# Patient Record
Sex: Female | Born: 1965 | Race: White | Hispanic: No | Marital: Married | Smoking: Current every day smoker
Health system: Southern US, Community
[De-identification: ages and names within clinical notes are randomized; demographics above are authoritative.]

## PROBLEM LIST (undated history)

## (undated) DIAGNOSIS — E079 Disorder of thyroid, unspecified: Secondary | ICD-10-CM

## (undated) DIAGNOSIS — F32A Depression, unspecified: Secondary | ICD-10-CM

## (undated) DIAGNOSIS — E119 Type 2 diabetes mellitus without complications: Secondary | ICD-10-CM

## (undated) DIAGNOSIS — B1910 Unspecified viral hepatitis B without hepatic coma: Secondary | ICD-10-CM

## (undated) DIAGNOSIS — M797 Fibromyalgia: Secondary | ICD-10-CM

## (undated) DIAGNOSIS — I10 Essential (primary) hypertension: Secondary | ICD-10-CM

## (undated) DIAGNOSIS — F419 Anxiety disorder, unspecified: Secondary | ICD-10-CM

## (undated) DIAGNOSIS — F329 Major depressive disorder, single episode, unspecified: Secondary | ICD-10-CM

## (undated) DIAGNOSIS — E7800 Pure hypercholesterolemia, unspecified: Secondary | ICD-10-CM

## (undated) HISTORY — PX: ANKLE SURGERY: SHX546

## (undated) HISTORY — DX: Essential (primary) hypertension: I10

## (undated) HISTORY — DX: Fibromyalgia: M79.7

## (undated) HISTORY — DX: Major depressive disorder, single episode, unspecified: F32.9

## (undated) HISTORY — DX: Unspecified viral hepatitis B without hepatic coma: B19.10

## (undated) HISTORY — PX: ABDOMINAL HYSTERECTOMY: SHX81

## (undated) HISTORY — DX: Pure hypercholesterolemia, unspecified: E78.00

## (undated) HISTORY — PX: CARPAL TUNNEL RELEASE: SHX101

## (undated) HISTORY — DX: Disorder of thyroid, unspecified: E07.9

## (undated) HISTORY — DX: Depression, unspecified: F32.A

## (undated) HISTORY — DX: Type 2 diabetes mellitus without complications: E11.9

## (undated) HISTORY — DX: Anxiety disorder, unspecified: F41.9

---

## 2002-04-04 ENCOUNTER — Encounter: Admission: RE | Admit: 2002-04-04 | Discharge: 2002-04-04 | Payer: Self-pay | Admitting: Gastroenterology

## 2002-04-04 ENCOUNTER — Encounter: Payer: Self-pay | Admitting: Gastroenterology

## 2002-04-17 ENCOUNTER — Ambulatory Visit (HOSPITAL_COMMUNITY): Admission: RE | Admit: 2002-04-17 | Discharge: 2002-04-17 | Payer: Self-pay | Admitting: Gastroenterology

## 2007-11-19 ENCOUNTER — Ambulatory Visit (HOSPITAL_COMMUNITY): Admission: RE | Admit: 2007-11-19 | Discharge: 2007-11-19 | Payer: Self-pay | Admitting: Neurosurgery

## 2007-12-10 ENCOUNTER — Ambulatory Visit (HOSPITAL_COMMUNITY): Admission: RE | Admit: 2007-12-10 | Discharge: 2007-12-10 | Payer: Self-pay | Admitting: Neurosurgery

## 2009-11-27 IMAGING — CT CT CERVICAL SPINE W/ CM
4 of 7 series · 13 of 33 positions shown, 15 images · non-contrast
Comparison: None.
COMPARISON: None.

CLINICAL DATA: Chronic neck pain
TECHNIQUE: Multidetector CT imaging of the cervical spine was
performed following myelography.  Multiplanar CT image
reconstructions were also generated.

[Series 4: 2mm axial soft tissue · axial · 0.23mm/px · z∈[-203,-151]mm · 2 of 79 slices shown (1 of 2)]
[im 27/79  soft-tissue]
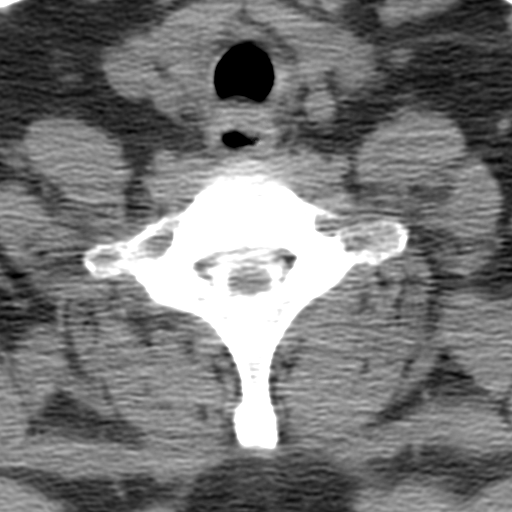
[im 53/79  soft-tissue]
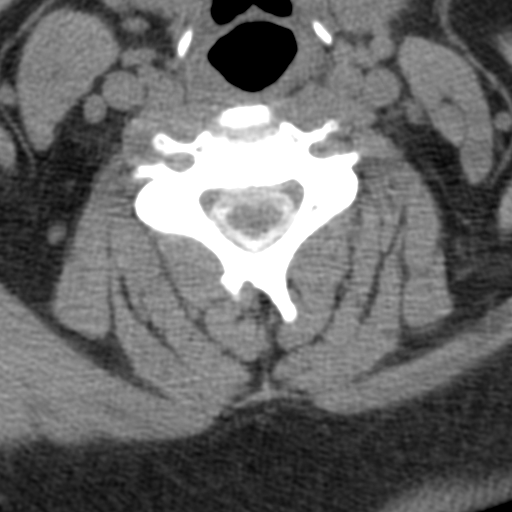

[Series 8: 2mm axial soft tissue · axial · 0.23mm/px · z∈[-207,-111]mm · 3 of 98 slices shown, 4 images (2 of 2)]
[im 25/98  soft-tissue]
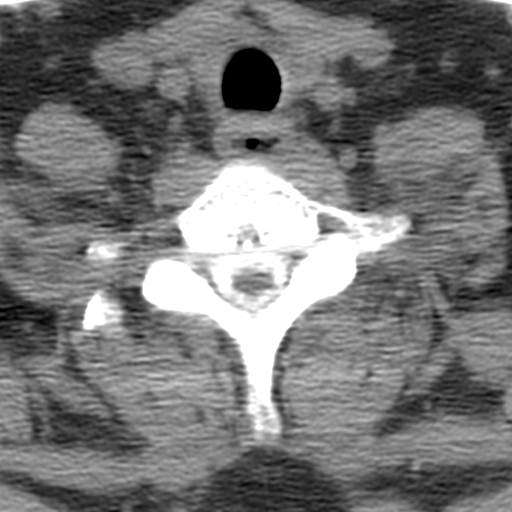
[im 25/98  bone]
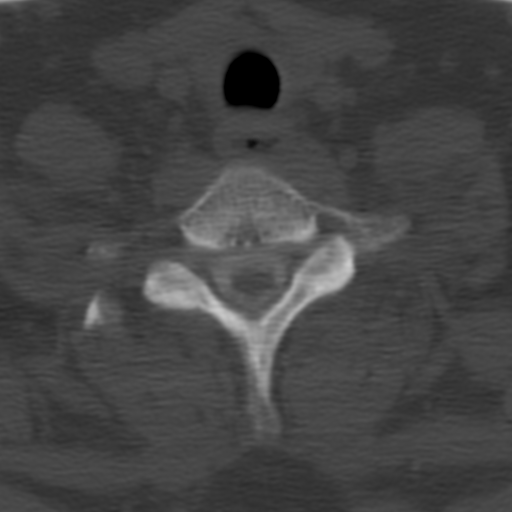
[im 49/98  bone]
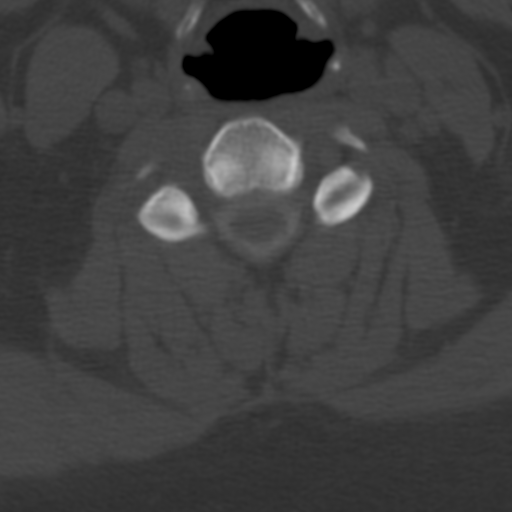
[im 73/98  bone]
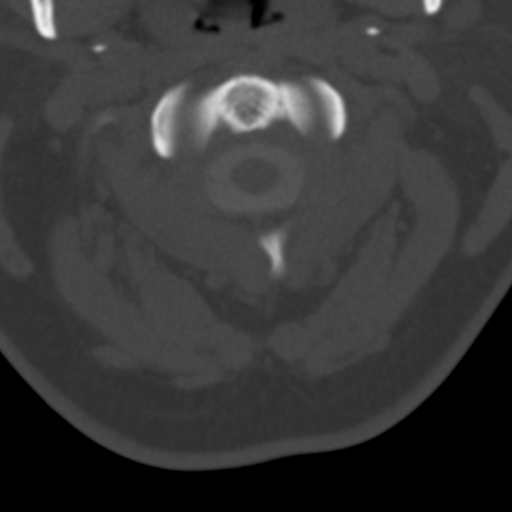

[Series 602: coronal detail · coronal · 0.38mm/px · 3 of 53 slices shown]
[im 11/53  bone]
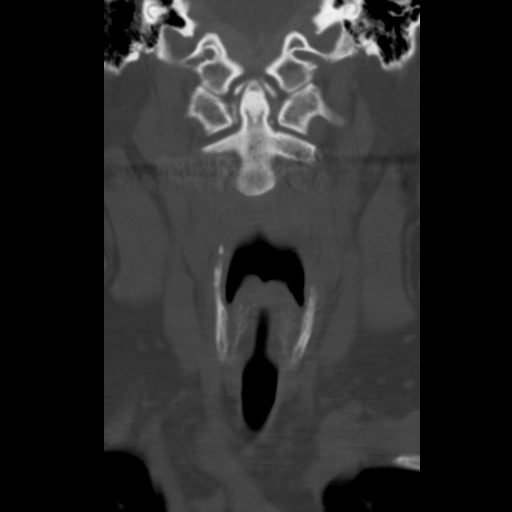
[im 21/53  bone]
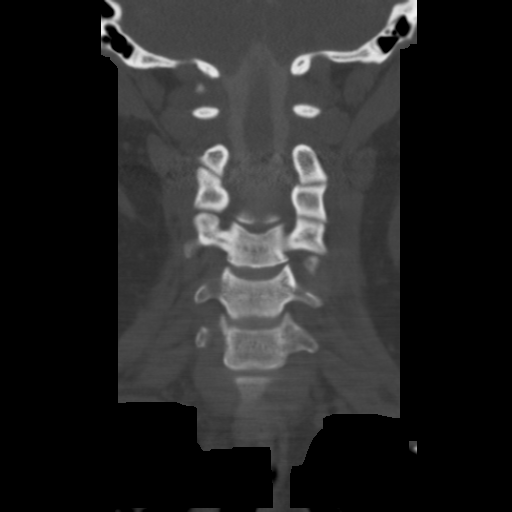
[im 32/53  bone]
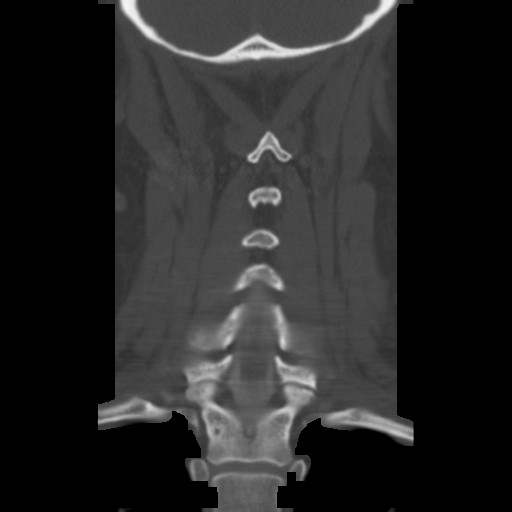

[Series 603: sagittal detail c-spine · sagittal · 0.38mm/px · 5 of 56 slices shown, 6 images]
[im 19/56  bone]
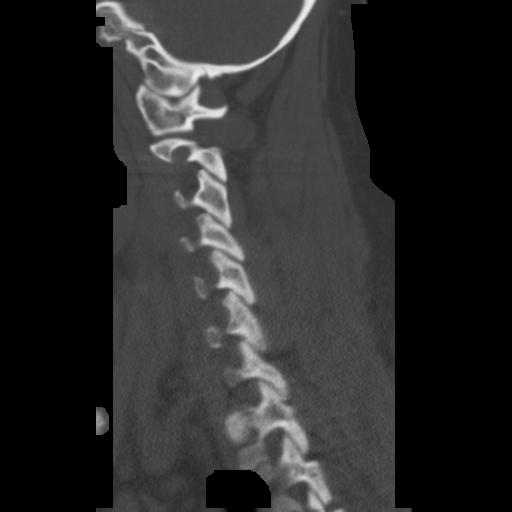
[im 23/56  bone]
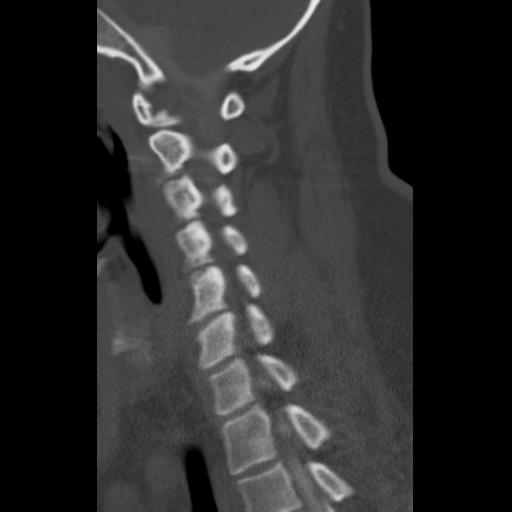
[im 28/56  soft-tissue]
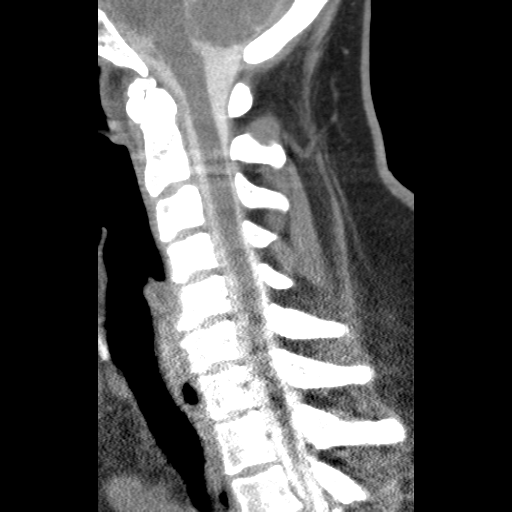
[im 28/56  bone]
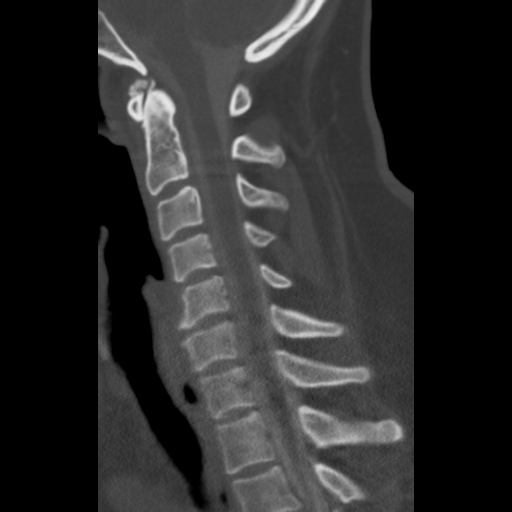
[im 33/56  bone]
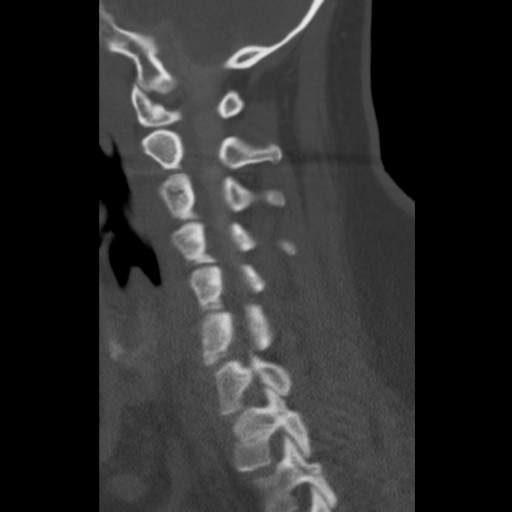
[im 37/56  bone]
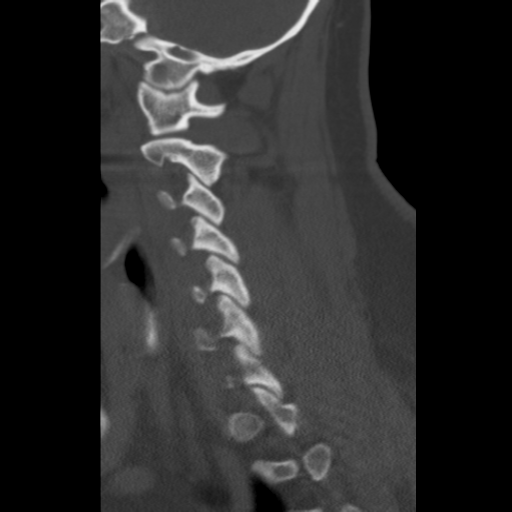

[13 of 33 positions shown; findings below may reference images not displayed]

MYELOGRAM CERVICAL

 Lumbar puncture was performed by Dr. Siroit, [facility] spine and
brain, at the L5-S1 level using a 22 gauge needle with return of
clear CSF.  10 cc of Omnipaque 300 was injected into the
subarachnoid space.  With the neck extended, the contrast was
allowed to flow into the cervical spine and  and myelographic
images were obtained.
FINDINGS: There is no significant nerve root cut off or spinal
stenosis.  Alignment is anatomic.  There appears to be mild annular
bulging at  C5-6.

Fluoroscopy Time: 1.19 minutes, lowest pulsed technique
IMPRESSION: As above

CT MYELOGRAPHY CERVICAL SPINE
FINDINGS: Anatomic alignment is present.  Craniocervical junction
unremarkable. Minimal degenerative change C1-C2 articulation.

C2-3: Normal.

C3-4: Normal.

C4-5: Normal.

C5-6: Mild bulge.  No stenosis or disc protrusion.

C6-7: Normal.

C7-T1: Normal.

No neck masses.  Airway widely patent.  Lung apices clear.  No
vascular calcification.
IMPRESSION: Mild annular bulging at C5-6 without compressive lesion or spinal
stenosis.

## 2010-09-25 ENCOUNTER — Encounter: Payer: Self-pay | Admitting: Neurosurgery

## 2011-01-20 NOTE — Op Note (Signed)
   NAME:  Catherine Ewing, Catherine Ewing                          ACCOUNT NO.:  [id number]   MEDICAL RECORD NO.:  [id number]                   PATIENT TYPE:  AMB   LOCATION:  ENDO                                 FACILITY:  [facility]   PHYSICIAN:  James L. Malon Kindle., M.D.          DATE OF BIRTH:  Mar 27, 1966   DATE OF PROCEDURE:  04/17/2002  DATE OF DISCHARGE:  04/17/2002                                 OPERATIVE REPORT   PROCEDURE:  Colonoscopy.   MEDICATIONS:  1. Cipro 400 mg  IV, due to history of heart murmur and the fact that she     was told that she needed medications for her murmur.  2. Fentanyl 50 mcg IV.  3. Versed 6 mg IV.   ENDOSCOPE:  Olympus adult video colonoscope.   INDICATIONS:  Rectal bleeding and a very strong family history of colon  cancer in grandmother.   DESCRIPTION OF PROCEDURE:  The patient procedure had been explained to the  patient and consent obtained.  The patient went to the operating room, and  was placed in the left lateral decubitus position.  The Olympus adult video  colonoscope inserted and advanced under direct visualization.  The scope was  passed to the cecum and the ileocecal valve and appendiceal orifice were  seen.  The scope was withdrawn, taking pictures in the colon and hepatic  flexure; transverse colon, splenic flexure, descending and sigmoid colon  seen well upon removal.  A large internal hemorrhoid seen in the rectum upon  retroflex view.  The scope was withdrawn and the patient tolerated the  procedure well.  Maintained on low-flow oxygen with pulse oximetry.   ASSESSMENT:  1. Rectal bleeding, probably due to internal hemorrhoids.  2. Family history in grandparent of colon cancer.   PLAN:  Give fiber, hemorrhoid sheet.  Recommend another colonoscopy at age  45 and see back in the office in two to three months.                                                James L. Malon Kindle., M.D.    Waldron Session  D:  04/17/2002  T:  04/19/2002  Job:   [id number]   cc:   Gaynelle Cage

## 2013-06-05 ENCOUNTER — Telehealth (HOSPITAL_COMMUNITY): Payer: Self-pay | Admitting: Dietician

## 2013-06-05 NOTE — Telephone Encounter (Signed)
Received referral via fax from Dr. Nyland for dx: diabetes.  

## 2013-06-05 NOTE — Telephone Encounter (Signed)
Called and left message on voicemail at [phone].

## 2013-06-12 NOTE — Telephone Encounter (Signed)
No response to previous contact attempt. Sent letter to pt home via US Mail in attempt to contact pt to schedule appointment.  

## 2013-06-18 NOTE — Telephone Encounter (Signed)
No response to previous contact attempts. Referral filed.  

## 2013-10-01 ENCOUNTER — Encounter: Payer: Self-pay | Admitting: Physical Medicine & Rehabilitation

## 2013-11-11 ENCOUNTER — Ambulatory Visit (HOSPITAL_COMMUNITY)
Admission: RE | Admit: 2013-11-11 | Discharge: 2013-11-11 | Disposition: A | Discharge status: Home / Self Care | Payer: Medicaid Other | Source: Ambulatory Visit | Attending: Physical Medicine & Rehabilitation | Admitting: Physical Medicine & Rehabilitation

## 2013-11-11 ENCOUNTER — Encounter: Payer: Self-pay | Admitting: Physical Medicine & Rehabilitation

## 2013-11-11 ENCOUNTER — Encounter: Payer: Medicaid Other | Attending: Physical Medicine & Rehabilitation | Admitting: Physical Medicine & Rehabilitation

## 2013-11-11 VITALS — BP 137/73 | HR 96 | Resp 16 | Ht 69.0 in | Wt 223.0 lb

## 2013-11-11 DIAGNOSIS — IMO0001 Reserved for inherently not codable concepts without codable children: Secondary | ICD-10-CM | POA: Insufficient documentation

## 2013-11-11 DIAGNOSIS — R141 Gas pain: Secondary | ICD-10-CM | POA: Insufficient documentation

## 2013-11-11 DIAGNOSIS — Z79899 Other long term (current) drug therapy: Secondary | ICD-10-CM

## 2013-11-11 DIAGNOSIS — Z5181 Encounter for therapeutic drug level monitoring: Secondary | ICD-10-CM

## 2013-11-11 DIAGNOSIS — G894 Chronic pain syndrome: Secondary | ICD-10-CM | POA: Insufficient documentation

## 2013-11-11 DIAGNOSIS — K5900 Constipation, unspecified: Secondary | ICD-10-CM | POA: Insufficient documentation

## 2013-11-11 DIAGNOSIS — G4700 Insomnia, unspecified: Secondary | ICD-10-CM

## 2013-11-11 DIAGNOSIS — E1149 Type 2 diabetes mellitus with other diabetic neurological complication: Secondary | ICD-10-CM | POA: Insufficient documentation

## 2013-11-11 DIAGNOSIS — F32A Depression, unspecified: Secondary | ICD-10-CM

## 2013-11-11 DIAGNOSIS — F329 Major depressive disorder, single episode, unspecified: Secondary | ICD-10-CM

## 2013-11-11 DIAGNOSIS — E1142 Type 2 diabetes mellitus with diabetic polyneuropathy: Secondary | ICD-10-CM

## 2013-11-11 DIAGNOSIS — F3289 Other specified depressive episodes: Secondary | ICD-10-CM | POA: Insufficient documentation

## 2013-11-11 DIAGNOSIS — F17200 Nicotine dependence, unspecified, uncomplicated: Secondary | ICD-10-CM | POA: Insufficient documentation

## 2013-11-11 DIAGNOSIS — R143 Flatulence: Secondary | ICD-10-CM

## 2013-11-11 DIAGNOSIS — R142 Eructation: Secondary | ICD-10-CM | POA: Insufficient documentation

## 2013-11-11 DIAGNOSIS — I10 Essential (primary) hypertension: Secondary | ICD-10-CM | POA: Insufficient documentation

## 2013-11-11 MED ORDER — CYCLOBENZAPRINE HCL 10 MG PO TABS: 10.0000 mg | ORAL_TABLET | Freq: Every day | ORAL | Status: DC

## 2013-11-11 MED ORDER — GABAPENTIN 300 MG PO CAPS: 300.0000 mg | ORAL_CAPSULE | Freq: Three times a day (TID) | ORAL | Status: AC

## 2013-11-11 NOTE — Progress Notes (Signed)
Subjective:    Patient ID: Catherine Ewing, female    DOB: Jan 24, 1966, 48 y.o.   MRN: [id number]  HPI  This is an initial visit for Catherine Ewing. She is having diffuse body pain. She feels miserable today, and has had increased pain since GB surgery a month. She has gained "25lbs" since surgery and feels increasingly bloated.   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 At that time she fell at work and "messed up" her right knee and ankle as well as her low back. She had several ESI's and management per Dr. Noel Gerold her in [place] and nothing really seemed to work. A year ago, she continued to have numbness and pain and she fell and broke her "shin" bones.   She was placed on lyrica for about a year and a half and eventually was switched to gabapentin because of cost. She has ibuprofen which she rarely uses.   Her history is also significant for DM and hypothyroidism as well as vit D deficiency   Her depression has grown worse because of all the problems. She has been on zoloft for about 15 years after her partial hysterectomy. She has been on 100mg  essentially the whole time.   She was placed on hydrocodone for a period of time which helped slightly.   At this point, she is having defuse body pain.    Pain Inventory Average Pain 9 Pain Right Now 7 My pain is constant, sharp, burning, tingling and aching  In the last 24 hours, has pain interfered with the following? General activity 9 Relation with others 8 Enjoyment of life 10 What TIME of day is your pain at its worst? daytime Sleep (in general) Fair  Pain is worse with: bending, sitting and standing Pain improves with: rest, medication and TENS Relief from Meds: 7  Mobility walk without assistance walk with assistance use a cane how many minutes can you walk? 5-10 ability to climb steps?  yes do you drive?  yes Do you have any goals in this area?  yes  Function not employed: date last employed June 2013 I need assistance with  the following:  household duties and shopping Do you have any goals in this area?  yes  Neuro/Psych weakness numbness tingling depression anxiety  Prior Studies x-rays CT/MRI  Physicians involved in your care [facility]   Family History  Problem Relation Age of Onset  . Cancer Mother   . Heart disease Mother   . Hypertension Mother   . Stroke Father    History   Social History  . Marital Status: Married    Spouse Name: N/A    Number of Children: N/A  . Years of Education: N/A   Social History Main Topics  . Smoking status: Current Every Day Smoker  . Smokeless tobacco: Never Used  . Alcohol Use: Yes  . Drug Use: No  . Sexual Activity: None   Other Topics Concern  . None   Social History Narrative  . None   Past Surgical History  Procedure Laterality Date  . Cesarean section    . Abdominal hysterectomy    . Ankle surgery    . Carpal tunnel release     Past Medical History  Diagnosis Date  . Depression   . Diabetes mellitus without complication   . Thyroid disease   . Hypertension    BP 137/73  Pulse 96  Resp 16  Ht 5\' 9"  (1.753 m)  Wt 223 lb (101.152 kg)  BMI 32.92 kg/m2  SpO2 99%  Opioid Risk Score: 1 Fall Risk Score: Moderate Fall Risk (6-13 points) (patient educated handout given)   Review of Systems  Constitutional: Positive for diaphoresis, appetite change and unexpected weight change.  Respiratory: Positive for shortness of breath.   Musculoskeletal: Positive for arthralgias, back pain and myalgias.  Neurological: Positive for weakness and numbness.  Psychiatric/Behavioral: Positive for dysphoric mood. The patient is nervous/anxious.   All other systems reviewed and are negative.       Objective:   Physical Exam   General: sitting on exam table. Hair highlighted. Smells of cigarettes HEENT: Head is normocephalic, atraumatic, PERRLA, EOMI, sclera anicteric, oral mucosa pink and moist, dentition  intact, ext ear canals clear,  Neck: Supple without JVD or lymphadenopathy Heart: Reg rate and rhythm. No murmurs rubs or gallops Chest: CTA bilaterally without wheezes, rales, or rhonchi; no distress Abdomen: Soft, non-tender, non-distended, bowel sounds positive. Extremities: No clubbing, cyanosis, or edema. Pulses are 2+ Skin: Clean and intact without signs of breakdown Neuro: Pt is cognitively appropriate with normal insight, memory, and awareness. Cranial nerves 2-12 are intact. Sensory exam is decreased to PP and LT below both knees and to the wrists bilaterally. DTR's are tr to 1+.. b. Fine motor coordination is fair. No tremors. Motor function is grossly 4 to 5/5 except where there is pain inhibition.  Musculoskeletal: pelvis aligned. Mild dextroscoliosis and significant spasm in right lumbar paraspinals. May be mild clockwise rotation. Antalgic with gait on the left. Pain is in left hip when she walks. SLR equivocal. SST equivocal. Low back at beltline tender to palpation. Limited in all planes of movement.  Psych: Pt's affect is depressed, anxious. She appears fatigued.         Assessment & Plan:  1. Chronic Pain syndrome: ?Fibromyalgia 2. Diabetes with peripheral neuropathy 3. Depression 4. Insomnia 5. Abdominal pain :  Slight distention. 25 lb weight gain over the last month after GB resection. Minimal bowel sounds on exam   Plan: 1. Need lumbar MRI to help define treatment plan 2. Increase gabapentin to 300mg  TID 3. Add flexeril 10-20mg  qhs. Consider TCA at night. 4. Will likely need to change antidepressant 5. Would like to pursue therapy pending imaging results.  6. Ordered KUB to see if there is an ileus/obstipation. Needs to see pcp and potentially surgeon pending results.  7. Follow up in a month. Spent 45 minutes with this patient today.  8. Needs follow up of thyroid and vit d levels.

## 2013-11-11 NOTE — Patient Instructions (Signed)
I NEED A COPY OR AT LEAST A REPORT OF YOUR LOW BACK MRI   PLEASE CALL ME WITH ANY PROBLEMS OR QUESTIONS [phone]).

## 2013-11-11 NOTE — Addendum Note (Signed)
Addended by: Judd GaudierLEVENS, SHANNON M on: 11/11/2013 10:56 AM   Modules accepted: Orders

## 2013-11-12 ENCOUNTER — Telehealth: Payer: Self-pay | Admitting: Physical Medicine & Rehabilitation

## 2013-11-12 NOTE — Telephone Encounter (Signed)
KUB showed no gas or bowel obstruction. Essentially was normal

## 2013-11-13 NOTE — Telephone Encounter (Signed)
Returned call, Left message for patient to call office regarding KUB results.

## 2013-12-12 ENCOUNTER — Telehealth: Payer: Self-pay

## 2013-12-12 MED ORDER — BACLOFEN 10 MG PO TABS: 10.0000 mg | ORAL_TABLET | Freq: Three times a day (TID) | ORAL | Status: AC | PRN

## 2013-12-12 NOTE — Telephone Encounter (Signed)
Patient is requesting a different medication for spasms. She said the Flexeril is not working. Please advise.

## 2013-12-12 NOTE — Telephone Encounter (Signed)
Patient informed of medication changed.  She will stop flexeril and start baclofen.

## 2013-12-12 NOTE — Telephone Encounter (Signed)
Wrote rx for baclofen and sent to pharmacy. Stop flexeril

## 2013-12-30 ENCOUNTER — Encounter: Payer: Medicaid Other | Attending: Physical Medicine & Rehabilitation | Admitting: Physical Medicine & Rehabilitation

## 2013-12-30 ENCOUNTER — Encounter: Payer: Self-pay | Admitting: Physical Medicine & Rehabilitation

## 2013-12-30 ENCOUNTER — Encounter: Payer: Medicaid Other | Admitting: Physical Medicine & Rehabilitation

## 2013-12-30 VITALS — BP 150/80 | HR 99 | Resp 14 | Ht 69.0 in | Wt 218.0 lb

## 2013-12-30 DIAGNOSIS — G4700 Insomnia, unspecified: Secondary | ICD-10-CM

## 2013-12-30 DIAGNOSIS — Z5181 Encounter for therapeutic drug level monitoring: Secondary | ICD-10-CM

## 2013-12-30 DIAGNOSIS — M47817 Spondylosis without myelopathy or radiculopathy, lumbosacral region: Secondary | ICD-10-CM

## 2013-12-30 DIAGNOSIS — E1142 Type 2 diabetes mellitus with diabetic polyneuropathy: Secondary | ICD-10-CM

## 2013-12-30 DIAGNOSIS — E1149 Type 2 diabetes mellitus with other diabetic neurological complication: Secondary | ICD-10-CM

## 2013-12-30 DIAGNOSIS — F32A Depression, unspecified: Secondary | ICD-10-CM

## 2013-12-30 DIAGNOSIS — K7460 Unspecified cirrhosis of liver: Secondary | ICD-10-CM

## 2013-12-30 DIAGNOSIS — IMO0001 Reserved for inherently not codable concepts without codable children: Secondary | ICD-10-CM

## 2013-12-30 DIAGNOSIS — Z79899 Other long term (current) drug therapy: Secondary | ICD-10-CM

## 2013-12-30 DIAGNOSIS — F3289 Other specified depressive episodes: Secondary | ICD-10-CM

## 2013-12-30 DIAGNOSIS — F329 Major depressive disorder, single episode, unspecified: Secondary | ICD-10-CM | POA: Insufficient documentation

## 2013-12-30 DIAGNOSIS — K5900 Constipation, unspecified: Secondary | ICD-10-CM

## 2013-12-30 MED ORDER — DULOXETINE HCL 30 MG PO CPEP: 30.0000 mg | ORAL_CAPSULE | Freq: Every day | ORAL | Status: DC

## 2013-12-30 MED ORDER — OXYCODONE HCL 5 MG PO TABS: 5.0000 mg | ORAL_TABLET | Freq: Four times a day (QID) | ORAL | Status: DC | PRN

## 2013-12-30 NOTE — Progress Notes (Signed)
Subjective:    Patient ID: Catherine KayMelissa S Ewing, female    DOB: 1965-11-07, 48 y.o.   MRN: [id number]  HPI  Catherine Ewing is back regarding her chronic pain. She had an MRI done at [facility] in March which shows mutlilevel facet disease and small DDD and HNP's. Also of note is considerable ascites and cirrhosis of the liver.  Her mood has continued to plummet. She has been on zoloft for 15 years and it doesn't seem to be working any more.   Her pain is mostly centered in her low back and pelvic areas. It is painful to lie flat, bend, or walk. It tends to bother her more on the left than the right. She uses a cane for balance. Her left leg still feels numb.   We increased her gabapentin at last visit without dramatic effect. It tends to "hype" her up more than it causes any sedation.   She was placed on invokana which is causing her to urinate quite a bit---this keeps her up at night.   She has an appt to see GI this afternoon to discuss the treatment plan for her ascites.   Pain Inventory Average Pain 7 Pain Right Now 6 My pain is constant, sharp, burning, tingling and aching  In the last 24 hours, has pain interfered with the following? General activity 3 Relation with others 4 Enjoyment of life 3 What TIME of day is your pain at its worst? evening, night Sleep (in general) Poor  Pain is worse with: sitting, standing and some activites Pain improves with: pacing activities, medication and TENS Relief from Meds: 6  Mobility walk with assistance use a cane how many minutes can you walk? 5-10 ability to climb steps?  yes do you drive?  yes Do you have any goals in this area?  yes  Function not employed: date last employed 03/03/11 I need assistance with the following:  household duties and shopping Do you have any goals in this area?  yes  Neuro/Psych weakness numbness tingling spasms depression  Prior Studies Any changes since last visit?  yes CT/MRI  Physicians involved  in your care Any changes since last visit?  no   Family History  Problem Relation Age of Onset  . Cancer Mother   . Heart disease Mother   . Hypertension Mother   . Stroke Father    History   Social History  . Marital Status: Married    Spouse Name: N/A    Number of Children: N/A  . Years of Education: N/A   Social History Main Topics  . Smoking status: Current Every Day Smoker  . Smokeless tobacco: Never Used  . Alcohol Use: Yes  . Drug Use: No  . Sexual Activity: None   Other Topics Concern  . None   Social History Narrative  . None   Past Surgical History  Procedure Laterality Date  . Cesarean section    . Abdominal hysterectomy    . Ankle surgery    . Carpal tunnel release     Past Medical History  Diagnosis Date  . Depression   . Diabetes mellitus without complication   . Thyroid disease   . Hypertension    BP 150/80  Pulse 99  Resp 14  Ht 5\' 9"  (1.753 m)  Wt 218 lb (98.884 kg)  BMI 32.18 kg/m2  SpO2 99%  Opioid Risk Score:   Fall Risk Score: Moderate Fall Risk (6-13 points) (pt educated and given a brochure on  fall risk previously)   Review of Systems  Constitutional: Positive for diaphoresis, appetite change and unexpected weight change.  Gastrointestinal: Positive for nausea and diarrhea.  Endocrine:       High blood sugar  Musculoskeletal: Positive for back pain.  Neurological: Positive for weakness and numbness.       Tingling, spasms  Psychiatric/Behavioral:       Depression  All other systems reviewed and are negative.      Objective:   Physical Exam  General: sitting on exam table. Hair highlighted. Smells of cigarettes  HEENT: Head is normocephalic, atraumatic, PERRLA, EOMI, sclera anicteric, oral mucosa pink and moist, dentition intact, ext ear canals clear,  Neck: Supple without JVD or lymphadenopathy  Heart: Reg rate and rhythm. No murmurs rubs or gallops  Chest: CTA bilaterally without wheezes, rales, or rhonchi; no  distress  Abdomen: Soft,  distended, fluid noted Extremities: No clubbing, cyanosis, or trace LLE edema. Pulses are 2+  Skin: Clean and intact without signs of breakdown  Neuro: Pt is cognitively appropriate with normal insight, memory, and awareness. Cranial nerves 2-12 are intact. Sensory exam is decreased to PP and LT below both knees and to the wrists bilaterally. DTR's are tr to 1+.. b. Fine motor coordination is fair. No tremors. Motor function is grossly 4 to 5/5 except where there is pain inhibition.  Musculoskeletal: pelvis aligned. Mild dextroscoliosis and significant spasm in right lumbar paraspinals.   mild clockwise rotation. Antalgic with gait on the left. Pain is in left hip and low back with weight bearing   SLR equivocal. SST equivocal. Low back at beltline tender to palpation. Limited in all planes of movement.  Psych: Pt's affect is depressed, tearful and anxious. She appears fatigued.   Assessment & Plan:   1. Chronic Pain syndrome: ?Fibromyalgia  2. Diabetes with peripheral neuropathy  3. Depression  4. Insomnia  5. Abdominal pain with ascites and cirrhosis, Hep B +? 6. 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 Will try low dose oxycodone for pain 5mg  q12 prn #60.   2. Maintain  gabapentin at 300mg  TID  3. Baclofen prn for spasm.  4. Will transition her to cymbalta GI permitting (I asked her to check with GI today when she sees him). Will taper off the zoloft if we go forward with the cymbalta.  5. Consider injections after cirrhosis and ascites has been treated.   6. Treatment of liver per GI. This really needs to be managed first before we get aggressive from our standpoint.   7. Follow up in about a month. Spent 30 minutes with this patient today.

## 2013-12-30 NOTE — Patient Instructions (Signed)
TAKE 1/2 ZOLOFT DAILY FOR 2 WEEKS. THEN TAKE 1/2 EVERY OTHER DAY FOR 2 WEEKS THEN STOP.   CHECK WITH YOUR GI DOCTOR REGARDING WHETHER HE FEELS IT'S SAFE TO TAKE CYMBALTA. IF NOT, GIVE ME A CALL AND WE'LL TRY SOMETHING ELSE.

## 2014-01-06 ENCOUNTER — Other Ambulatory Visit: Payer: Self-pay | Admitting: Physical Medicine & Rehabilitation

## 2014-01-06 NOTE — Progress Notes (Signed)
Urine drug screen from 12/30/2013 was consistent. 

## 2014-02-17 ENCOUNTER — Encounter: Payer: Medicaid Other | Admitting: Physical Medicine & Rehabilitation

## 2014-03-02 DIAGNOSIS — G629 Polyneuropathy, unspecified: Secondary | ICD-10-CM | POA: Insufficient documentation

## 2014-03-02 DIAGNOSIS — E785 Hyperlipidemia, unspecified: Secondary | ICD-10-CM | POA: Insufficient documentation

## 2014-03-02 DIAGNOSIS — M549 Dorsalgia, unspecified: Secondary | ICD-10-CM | POA: Insufficient documentation

## 2014-03-02 DIAGNOSIS — F419 Anxiety disorder, unspecified: Secondary | ICD-10-CM | POA: Insufficient documentation

## 2014-03-02 DIAGNOSIS — I10 Essential (primary) hypertension: Secondary | ICD-10-CM | POA: Insufficient documentation

## 2014-04-15 DIAGNOSIS — Z01419 Encounter for gynecological examination (general) (routine) without abnormal findings: Secondary | ICD-10-CM | POA: Insufficient documentation

## 2014-04-15 DIAGNOSIS — R011 Cardiac murmur, unspecified: Secondary | ICD-10-CM | POA: Insufficient documentation

## 2014-04-27 ENCOUNTER — Encounter: Payer: Medicaid Other | Admitting: Physical Medicine & Rehabilitation

## 2014-04-27 ENCOUNTER — Ambulatory Visit: Payer: Medicaid Other | Admitting: Neurology

## 2014-04-29 ENCOUNTER — Ambulatory Visit: Payer: Medicaid Other | Admitting: Neurology

## 2014-05-01 ENCOUNTER — Encounter: Payer: Medicaid Other | Attending: Physical Medicine & Rehabilitation | Admitting: Physical Medicine & Rehabilitation

## 2014-05-01 ENCOUNTER — Other Ambulatory Visit: Payer: Self-pay

## 2014-05-01 ENCOUNTER — Encounter: Payer: Self-pay | Admitting: Physical Medicine & Rehabilitation

## 2014-05-01 VITALS — BP 127/70 | HR 93 | Resp 14 | Ht 69.0 in | Wt 184.0 lb

## 2014-05-01 DIAGNOSIS — M47817 Spondylosis without myelopathy or radiculopathy, lumbosacral region: Secondary | ICD-10-CM

## 2014-05-01 DIAGNOSIS — E1149 Type 2 diabetes mellitus with other diabetic neurological complication: Secondary | ICD-10-CM | POA: Insufficient documentation

## 2014-05-01 DIAGNOSIS — F3289 Other specified depressive episodes: Secondary | ICD-10-CM | POA: Insufficient documentation

## 2014-05-01 DIAGNOSIS — F329 Major depressive disorder, single episode, unspecified: Secondary | ICD-10-CM | POA: Diagnosis not present

## 2014-05-01 DIAGNOSIS — IMO0001 Reserved for inherently not codable concepts without codable children: Secondary | ICD-10-CM | POA: Insufficient documentation

## 2014-05-01 DIAGNOSIS — F32A Depression, unspecified: Secondary | ICD-10-CM

## 2014-05-01 DIAGNOSIS — G8929 Other chronic pain: Secondary | ICD-10-CM | POA: Insufficient documentation

## 2014-05-01 DIAGNOSIS — K7460 Unspecified cirrhosis of liver: Secondary | ICD-10-CM | POA: Diagnosis not present

## 2014-05-01 DIAGNOSIS — Z79899 Other long term (current) drug therapy: Secondary | ICD-10-CM | POA: Diagnosis not present

## 2014-05-01 DIAGNOSIS — E1142 Type 2 diabetes mellitus with diabetic polyneuropathy: Secondary | ICD-10-CM | POA: Insufficient documentation

## 2014-05-01 MED ORDER — SERTRALINE HCL 100 MG PO TABS: 150.0000 mg | ORAL_TABLET | Freq: Every day | ORAL | Status: AC

## 2014-05-01 MED ORDER — OXYCODONE HCL 5 MG PO TABS: 5.0000 mg | ORAL_TABLET | Freq: Four times a day (QID) | ORAL | Status: AC | PRN

## 2014-05-01 NOTE — Patient Instructions (Signed)
GABAPENTIN TAPER: 600-300-600MG  FOR 4 DAYS, THEN-- 300-300-600MG  FOR 4 DAYS, THEN--  THREE X PER DAY.

## 2014-05-01 NOTE — Progress Notes (Signed)
Subjective:    Patient ID: Catherine Ewing, female    DOB: [birth year], 47 y.o.   MRN: [id number]  HPI  Catherine Ewing is back regarding her chronic pain. She was diagnosed with hepatitis B since I last saw her.   She went back to zoloft because the change to cymbalta increased her depression and caused her to be quite irritable. She never really transitioned all the way over.   Her thyroid was recently checked apparently, and this was normal per pt.    Her gabapentin was increased to  TID per her PCP about 3 months ago.   She fell at home the other night when she didn't have her cane. She has reported increased tremor and problems with her balance over the last few months.    Her sugars have been better over the last few months. She has come off insulin. Her hgb A1C was around 6 (per pt)  The oxycodone seems to help her sleep. She typically only uses it at night only. She still has pills left from last visit.   Pain Inventory Average Pain 7 Pain Right Now 6 My pain is sharp, tingling, aching and other  In the last 24 hours, has pain interfered with the following? General activity 3 Relation with others 4 Enjoyment of life 4 What TIME of day is your pain at its worst? evening, night Sleep (in general) Fair  Pain is worse with: walking, sitting, standing and some activites Pain improves with: rest Relief from Meds: 6  Mobility walk with assistance use a cane how many minutes can you walk? 5 ability to climb steps?  yes do you drive?  yes Do you have any goals in this area?  yes  Function not employed: date last employed 03/03/2011  Neuro/Psych weakness numbness tingling trouble walking spasms dizziness depression anxiety  Prior Studies Any changes since last visit?  no  Physicians involved in your care Any changes since last visit?  yes Primary care Dr. Hosie Ewing Neurologist Dr. Denzil Ewing   Family History  Problem Relation Age of Onset  . Cancer Mother   .  Heart disease Mother   . Hypertension Mother   . Stroke Father    History   Social History  . Marital Status: Married    Spouse Name: N/A    Number of Children: N/A  . Years of Education: N/A   Social History Main Topics  . Smoking status: Current Every Day Smoker  . Smokeless tobacco: Never Used  . Alcohol Use: Yes  . Drug Use: No  . Sexual Activity: None   Other Topics Concern  . None   Social History Narrative  . None   Past Surgical History  Procedure Laterality Date  . Cesarean section    . Abdominal hysterectomy    . Ankle surgery    . Carpal tunnel release     Past Medical History  Diagnosis Date  . Depression   . Diabetes mellitus without complication   . Thyroid disease   . Hypertension   . Hepatitis B    BP 127/70  Pulse 93  Resp 14  Ht  (1.753 m)  Wt 184 lb (83.462 kg)  BMI 27.16 kg/m2  SpO2 98%  Opioid Risk Score:   Fall Risk Score: Moderate Fall Risk (6-13 points) (pt educated, declined handout)    Review of Systems  Constitutional: Positive for diaphoresis and unexpected weight change.  Endocrine:       High blood sugar  Neurological: Positive for dizziness, weakness and numbness.       Tingling, spasms  Psychiatric/Behavioral: The patient is nervous/anxious.        Depression  All other systems reviewed and are negative.      Objective:   Physical Exam  General: sitting on exam table. Hair highlighted.  Appears depressed HEENT: Head is normocephalic, atraumatic, PERRLA, EOMI, sclera anicteric, oral mucosa pink and moist, dentition intact, ext ear canals clear,  Neck: Supple without JVD or lymphadenopathy  Heart: Reg rate and rhythm. No murmurs rubs or gallops  Chest: CTA bilaterally without wheezes, rales, or rhonchi; no distress  Abdomen: Soft, distended, fluid noted  Extremities: No clubbing, cyanosis, or trace LLE edema. Pulses are 2+  Skin: Clean and intact without signs of breakdown  Neuro: Pt is cognitively  appropriate with normal insight, memory, and awareness. Cranial nerves 2-12 are intact. Sensory exam is decreased to PP and LT below both knees and to the wrists bilaterally. DTR's are tr to 1+.. b. Fine motor coordination is fair. No tremors. Motor function is grossly 4 to 5/5 except where there is pain inhibition. Tremor resting in both UE's.  Musculoskeletal: pelvis aligned. Mild dextroscoliosis and significant spasm in right lumbar paraspinals. mild clockwise rotation. Antalgic with gait on the left and right today. Pain is in left hip and low back with weight bearing SLR equivocal. SST equivocal. Limited with all ROM. Psych: Pt's affect is depressed,  and anxious. She appears fatigued.   Assessment & Plan:   1. Chronic Pain syndrome: ?Fibromyalgia  2. Diabetes with peripheral neuropathy  3. Depression  4. Insomnia  5. Abdominal pain with ascites and cirrhosis, Hep B +?  6. Lumbar spondylosis with ddd and facet arthropathy    Plan:  1. continue low dose oxycodone for pain  q12 prn #60.  2. decrease gabapentin back to  TID---increase may have been contributing to tremors and worsened balance.  3. Baclofen prn for spasm--may use at hs  4. Increase zoloft for depression to  qd. May need to seek formal psychiatric assistance.  5. Consider injections for back at a later date  6. Treatment of liver per GI.  Recommend a follow up ammonia level at next visit as I am sure they are planning to do. Ammonia toxicity could be contributing to her tremor. 7. Follow up in about 3 months. Spent 30 minutes with this patient today.

## 2014-05-04 ENCOUNTER — Encounter: Payer: Medicaid Other | Admitting: Physical Medicine & Rehabilitation

## 2014-05-18 ENCOUNTER — Ambulatory Visit (INDEPENDENT_AMBULATORY_CARE_PROVIDER_SITE_OTHER): Payer: Medicaid Other | Admitting: Neurology

## 2014-05-18 ENCOUNTER — Ambulatory Visit: Payer: Medicaid Other | Admitting: Neurology

## 2014-05-18 ENCOUNTER — Encounter: Payer: Self-pay | Admitting: Neurology

## 2014-05-18 VITALS — BP 127/82 | HR 71 | Ht 69.0 in | Wt 190.0 lb

## 2014-05-18 DIAGNOSIS — R251 Tremor, unspecified: Secondary | ICD-10-CM

## 2014-05-18 DIAGNOSIS — R259 Unspecified abnormal involuntary movements: Secondary | ICD-10-CM

## 2014-05-18 NOTE — Patient Instructions (Addendum)
  Please remember, that any kind of tremor may be exacerbated by anxiety, anger, nervousness, excitement, dehydration, sleep deprivation, by caffeine, and low blood sugar values or blood sugar fluctuations. Some medications, especially some antidepressants and lithium can cause or exacerbate tremors. Tremors may temporarily calm down her subside with the use of a benzodiazepine such as Valium or related medications and with alcohol. Be aware however that drinking alcohol is not an approved treatment or appropriate treatment for tremor control and long-term use of benzodiazepines such as Valium, lorazepam, alprazolam, or clonazepam can cause habit formation, physical and psychological addiction.  I would like to get records from Dr. Leeanne Rio. I don't think you need additional testing from my end of things. You may have noticed a tremor since the increase in the Zoloft.   I will see you back as needed. Use your cane at all times.   Please stop smoking.

## 2014-05-18 NOTE — Progress Notes (Signed)
Subjective:    Patient ID: LONNETTE SHRODE is a 48 y.o. female.  HPI    Huston Foley, MD, PhD [facility] [address] [address] [place], [place] [address]  Dear Natalia Leatherwood,   I saw your patient, Nomi Rudnicki, upon your kind request in my neurologic clinic today for initial consultation of her tremors and balance problems. The patient is unaccompanied today. As you know, Ms. Trotta, is a 48 year old right-handed woman with an underlying medical history of hypertension, hepatitis B and liver cirrhosis (followed by Dr. Jones Skene), thyroid disease, diabetes, depression, chronic back pain on narcotic pain medication, (L leg fracture) for which she is followed by Dr. Riley Kill, who reports tremors and dizziness. She previously saw a neurologist for chronic pain, neuropathy, possible fibromyalgia. I do not have records available from her prior neurologist. I have asked her to fill out a release of information form so we can get records from her previous neurologist, Dr. Leeanne Rio [facility] in [place]) and she was also previously referred to a psychiatrist and but had not followed through with that. She has had problems walking because of prior ankle injury and ankle surgery. She was recently referred to physical therapy for this by your office.  She reports a b/l hand and head tremor for the past month, but can wake her up at night. Her family has noted the tremor. She believes, her uncle had Parkinson's. She has not noted a voice tremor. She has been given a 4 prong cane, for the past 5-6 months.  She has previously seen a spine specialist.  She lives with her two daughters, age 7 and 38.  She quit smoking for 15 years, but smokes now about 1/2 to 1 ppd. She drinks alcohol rarely. She was never a heavy drinker. She has been on Zoloft for 16 years, and recently increased it to 150 mg daily. She wonders, if she needs something else. She could not tolerate the  Cymbalta.   Her Past Medical History Is Significant For: Past Medical History  Diagnosis Date  . Depression   . Diabetes mellitus without complication   . Thyroid disease   . Hypertension   . Hepatitis B   . High cholesterol   . Anxiety   . Depression   . Fibromyalgia     Her Past Surgical History Is Significant For: Past Surgical History  Procedure Laterality Date  . Cesarean section    . Abdominal hysterectomy    . Ankle surgery    . Carpal tunnel release      Her Family History Is Significant For: Family History  Problem Relation Age of Onset  . Cancer Mother   . Heart disease Mother   . Hypertension Mother   . Stroke Father     Her Social History Is Significant For: History   Social History  . Marital Status: Married    Spouse Name: N/A    Number of Children: 2  . Years of Education: College   Occupational History  .  Other   Social History Main Topics  . Smoking status: Current Every Day Smoker  . Smokeless tobacco: Never Used  . Alcohol Use: Yes  . Drug Use: No  . Sexual Activity: None   Other Topics Concern  . None   Social History Narrative   Patient lives at home family   Caffeine Use: 6 sodas daily     Her Allergies Are:  Allergies  Allergen Reactions  .  Codeine   . Penicillins   :   Her Current Medications Are:  Outpatient Encounter Prescriptions as of 05/18/2014  Medication Sig  . Canagliflozin (INVOKANA) 300 MG TABS Take 300 mg by mouth daily.  . Cholecalciferol (VITAMIN D-3 PO) Take 1,000 Units by mouth daily.  . clindamycin (CLEOCIN) 300 MG capsule Take 300 mg by mouth 3 (three) times daily as needed.  . gabapentin (NEURONTIN) 300 MG capsule Take 1 capsule (300 mg total) by mouth 3 (three) times daily.  Marland Kitchen levothyroxine (SYNTHROID, LEVOTHROID) 150 MCG tablet Take 150 mcg by mouth daily before breakfast.  . lisinopril-hydrochlorothiazide (PRINZIDE,ZESTORETIC) 20-12.5 MG per tablet Take 1 tablet by mouth daily.  . metFORMIN  (GLUCOPHAGE) 1000 MG tablet Take 1,000 mg by mouth 2 (two) times daily with a meal.  . Multiple Vitamin (MULTIVITAMIN) capsule Take 1 capsule by mouth daily.  Marland Kitchen oxyCODONE (OXY IR/ROXICODONE) 5 MG immediate release tablet Take 1 tablet (5 mg total) by mouth every 6 (six) hours as needed for severe pain.  Marland Kitchen sertraline (ZOLOFT) 100 MG tablet Take 1.5 tablets (150 mg total) by mouth daily.  . simvastatin (ZOCOR) 20 MG tablet Take 20 mg by mouth daily.  Marland Kitchen tenofovir (VIREAD) 300 MG tablet Take 300 mg by mouth daily.  . [DISCONTINUED] ibuprofen (ADVIL,MOTRIN) 800 MG tablet Take 800 mg by mouth as needed.  . baclofen (LIORESAL) 10 MG tablet Take 1 tablet (10 mg total) by mouth every 8 (eight) hours as needed for muscle spasms.  Marland Kitchen omeprazole (PRILOSEC) 40 MG capsule Take 40 mg by mouth daily.  . [DISCONTINUED] insulin glargine (LANTUS) 100 UNIT/ML injection Inject into the skin at bedtime.  :   Review of Systems:  Out of a complete 14 point review of systems, all are reviewed and negative with the exception of these symptoms as listed below:   Review of Systems  Constitutional: Positive for fatigue.  Cardiovascular:       Murmur  Endocrine: Positive for heat intolerance and polydipsia.  Musculoskeletal: Positive for arthralgias, joint swelling and myalgias.       Cramps  Neurological: Positive for dizziness, weakness and numbness.  Psychiatric/Behavioral: Positive for sleep disturbance (sleepiness). The patient is nervous/anxious.     Objective:  Neurologic Exam  Physical Exam Physical Examination:   Filed Vitals:   05/18/14 0920  BP: 127/82  Pulse: 71    General Examination: The patient is a very pleasant 48 y.o. female in no acute distress. She appears well-developed and well-nourished and well groomed.   HEENT: Normocephalic, atraumatic, pupils are equal, round and reactive to light and accommodation. Funduscopic exam is normal with sharp disc margins noted. Extraocular tracking is  good without limitation to gaze excursion or nystagmus noted. Normal smooth pursuit is noted. Hearing is grossly intact. Tympanic membranes are clear bilaterally. Face is symmetric with normal facial animation and normal facial sensation. Speech is clear with no dysarthria noted. There is no hypophonia. There is a slight intermittent head tremor, no voice tremor or lip tremor is noted. Neck is supple with full range of passive and active motion. There are no carotid bruits on auscultation. Oropharynx exam reveals: mild mouth dryness, adequate dental hygiene and no airway crowding. Mallampati is class I. Tongue protrudes centrally and palate elevates symmetrically. Tonsils are small.   Chest: Clear to auscultation without wheezing, rhonchi or crackles noted.  Heart: S1+S2+0, regular and normal without murmurs, rubs or gallops noted.   Abdomen: Soft, non-tender and non-distended with normal bowel sounds appreciated on  auscultation.  Extremities: There is 1+ pitting edema in the L ankle. Pedal pulses are intact.  Skin: Warm and dry without trophic changes noted. There are some mild varicose veins in both posterior legs.   Musculoskeletal: exam reveals no obvious joint deformities, tenderness or joint swelling or erythema, with the exception of scar on the medial aspect of the L ankle. She also has decrease in range of motion in her left foot.  Neurologically:  Mental status: The patient is awake, alert and oriented in all 4 spheres. Her immediate and remote memory, attention, language skills and fund of knowledge are appropriate. There is no evidence of aphasia, agnosia, apraxia or anomia. Speech is clear with normal prosody and enunciation. Thought process is linear. Mood is decreased range and affect is constricted.  Cranial nerves II - XII are as described above under HEENT exam. In addition: shoulder shrug is normal with equal shoulder height noted. Motor exam: Normal bulk, strength and tone is  noted, with the exception of 4/5 strength in her left foot. There is no drift, or rebound. There is a bilateral upper extremity postural and action tremor, which is mild in degree. There tremor frequency is fairly fast and the amplitude is small. On Archimedes spiral drawing there is mild tremulousness noted. Handwriting is not particularly  tremulous and legible. There is no evidence of micrographia.  Romberg is negative. Reflexes are 2+ throughou in both upper extremities and both knees, trace in both ankles. Toes are flexor bilaterally. Fine motor skills and coordination: intact with normal finger taps, normal hand movements, normal rapid alternating patting, normal foot taps and normal foot agility.  Cerebellar testing: No dysmetria or intention tremor on finger to nose testing. Heel to shin is difficult for her. There is no truncal or gait ataxia.  Sensory exam: intact to light touch, pinprick, vibration, temperature sense in both upper and the right lower extremity but decrease in pinprick sensation in the left foot. She states that this has been unchanged since her surgery to the left leg.   Gait, station and balance: She stands with difficulty and pushes herself up. She favors her right leg and tends to lean a little bit more to the right. She walks with her 4 pronged cane with a limp. She turns slowly. I did not have her perform tandem walk.  Assessment and Plan:   In summary, Oniya Mandarino Geralds is a very pleasant 48 y.o.-year old female with an underlying medical history of hypertension, hepatitis B and liver cirrhosis (followed by Dr. Jones Skene), thyroid disease, diabetes, depression, chronic back pain on narcotic pain medication, (L leg fracture) for which she is followed by Dr. Riley Kill, who reports tremors and balance problems. She previously saw a neurologist for chronic pain, neuropathy, possible fibromyalgia. She reports her left leg gives out. Upon examination she has left foot numbness  and weakness which she reports has been stable since her left leg surgery. She has a very mild intermittent tremor in her hands and to a lesser degree in her head.  Her examination is negative for parkinsonism and I reassured her in that regard. 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 Differential diagnosis mainly is essential tremor. I suggested that we not start any symptomatic treatment, as her tremor is rather mild and something like primidone is clear to deliver and I would like to avoid additional medication in her case, given her liver disease history. I advised  her that there is no specific treatment available for tremors and most likely if this is exacerbated by a recent increase in her antidepressant she may improve with time. Also stress is a big trigger for tremors and it may in part be related to stress and exacerbation of depression. She does report residual depression. As I understand, you have talked to her about referring her to a psychiatrist. She seems amenable and is encouraged to bring this up with you. She has blood work pending for the next few months and I advised her to reduction checked. She is advised to quit smoking. She is advised to sign a release of information so we can get records from her prior neurologist. She was in agreement. At this juncture, I can see her on an as-needed basis. I did talk to her at length about potential tremor triggers. She is advised to be better hydrated with water.   I answered all her questions today and the patient was in agreement with the above outlined plan. Thank you very much for allowing me to participate in the care of this nice patient. If I can be of any further assistance to you please do not hesitate to call me at [phone].  Sincerely,   Huston Foley, MD, PhD

## 2014-08-03 ENCOUNTER — Encounter: Payer: Medicaid Other | Attending: Physical Medicine & Rehabilitation | Admitting: Physical Medicine & Rehabilitation

## 2016-11-02 DEATH — deceased
